# Patient Record
Sex: Male | Born: 1955 | Race: Black or African American | Hispanic: No | Marital: Single | Smoking: Former smoker
Health system: Southern US, Community
[De-identification: ages and names within clinical notes are randomized; demographics above are authoritative.]

## PROBLEM LIST (undated history)

## (undated) DIAGNOSIS — E785 Hyperlipidemia, unspecified: Secondary | ICD-10-CM

## (undated) DIAGNOSIS — I10 Essential (primary) hypertension: Secondary | ICD-10-CM

## (undated) DIAGNOSIS — E119 Type 2 diabetes mellitus without complications: Secondary | ICD-10-CM

## (undated) DIAGNOSIS — I499 Cardiac arrhythmia, unspecified: Secondary | ICD-10-CM

---

## 1999-07-20 ENCOUNTER — Encounter: Admission: RE | Admit: 1999-07-20 | Discharge: 1999-10-18 | Payer: Self-pay | Admitting: Family Medicine

## 2001-01-05 ENCOUNTER — Ambulatory Visit (HOSPITAL_COMMUNITY): Admission: RE | Admit: 2001-01-05 | Discharge: 2001-01-05 | Payer: Self-pay | Admitting: Gastroenterology

## 2017-04-09 ENCOUNTER — Other Ambulatory Visit: Payer: Self-pay

## 2017-04-09 ENCOUNTER — Encounter (HOSPITAL_BASED_OUTPATIENT_CLINIC_OR_DEPARTMENT_OTHER): Payer: Self-pay | Admitting: Emergency Medicine

## 2017-04-09 ENCOUNTER — Emergency Department (HOSPITAL_BASED_OUTPATIENT_CLINIC_OR_DEPARTMENT_OTHER): Payer: 59

## 2017-04-09 ENCOUNTER — Emergency Department (HOSPITAL_BASED_OUTPATIENT_CLINIC_OR_DEPARTMENT_OTHER)
Admission: EM | Admit: 2017-04-09 | Discharge: 2017-04-09 | Disposition: A | Discharge status: Home / Self Care | Payer: 59 | Attending: Emergency Medicine | Admitting: Emergency Medicine

## 2017-04-09 DIAGNOSIS — Z794 Long term (current) use of insulin: Secondary | ICD-10-CM | POA: Insufficient documentation

## 2017-04-09 DIAGNOSIS — Z79899 Other long term (current) drug therapy: Secondary | ICD-10-CM | POA: Insufficient documentation

## 2017-04-09 DIAGNOSIS — R109 Unspecified abdominal pain: Secondary | ICD-10-CM | POA: Diagnosis present

## 2017-04-09 DIAGNOSIS — R1031 Right lower quadrant pain: Secondary | ICD-10-CM | POA: Diagnosis not present

## 2017-04-09 DIAGNOSIS — R1011 Right upper quadrant pain: Secondary | ICD-10-CM | POA: Diagnosis not present

## 2017-04-09 DIAGNOSIS — I10 Essential (primary) hypertension: Secondary | ICD-10-CM | POA: Insufficient documentation

## 2017-04-09 DIAGNOSIS — E119 Type 2 diabetes mellitus without complications: Secondary | ICD-10-CM | POA: Diagnosis not present

## 2017-04-09 DIAGNOSIS — Z7982 Long term (current) use of aspirin: Secondary | ICD-10-CM | POA: Diagnosis not present

## 2017-04-09 HISTORY — DX: Essential (primary) hypertension: I10

## 2017-04-09 HISTORY — DX: Hyperlipidemia, unspecified: E78.5

## 2017-04-09 HISTORY — DX: Type 2 diabetes mellitus without complications: E11.9

## 2017-04-09 LAB — CBC
HEMATOCRIT: 38.3 % — AB (ref 39.0–52.0)
HEMOGLOBIN: 13.3 g/dL (ref 13.0–17.0)
MCH: 30.9 pg (ref 26.0–34.0)
MCHC: 34.7 g/dL (ref 30.0–36.0)
MCV: 88.9 fL (ref 78.0–100.0)
Platelets: 165 10*3/uL (ref 150–400)
RBC: 4.31 MIL/uL (ref 4.22–5.81)
RDW: 12.3 % (ref 11.5–15.5)
WBC: 5.3 10*3/uL (ref 4.0–10.5)

## 2017-04-09 LAB — COMPREHENSIVE METABOLIC PANEL
ALBUMIN: 3.7 g/dL (ref 3.5–5.0)
ALK PHOS: 62 U/L (ref 38–126)
ALT: 22 U/L (ref 17–63)
ANION GAP: 4 — AB (ref 5–15)
AST: 19 U/L (ref 15–41)
BILIRUBIN TOTAL: 0.3 mg/dL (ref 0.3–1.2)
BUN: 15 mg/dL (ref 6–20)
CALCIUM: 8.7 mg/dL — AB (ref 8.9–10.3)
CO2: 27 mmol/L (ref 22–32)
Chloride: 102 mmol/L (ref 101–111)
Creatinine, Ser: 0.86 mg/dL (ref 0.61–1.24)
GFR calc Af Amer: >60 mL/min (ref >60)
GFR calc non Af Amer: >60 mL/min (ref >60)
GLUCOSE: 205 mg/dL — AB (ref 65–99)
POTASSIUM: 3.7 mmol/L (ref 3.5–5.1)
SODIUM: 133 mmol/L — AB (ref 135–145)
TOTAL PROTEIN: 6.9 g/dL (ref 6.5–8.1)

## 2017-04-09 LAB — URINALYSIS, ROUTINE W REFLEX MICROSCOPIC
BILIRUBIN URINE: NEGATIVE
Glucose, UA: >=500 mg/dL — AB
Hgb urine dipstick: NEGATIVE
KETONES UR: NEGATIVE mg/dL
Leukocytes, UA: NEGATIVE
Nitrite: NEGATIVE
PH: 5.5 (ref 5.0–8.0)
Protein, ur: NEGATIVE mg/dL

## 2017-04-09 LAB — URINALYSIS, MICROSCOPIC (REFLEX): RBC / HPF: NONE SEEN RBC/hpf (ref 0–5)

## 2017-04-09 LAB — LIPASE, BLOOD: Lipase: 145 U/L — ABNORMAL HIGH (ref 11–51)

## 2017-04-09 IMAGING — CT CT RENAL STONE PROTOCOL
2 of 4 series · 16 of 46 positions shown, 18 images · non-contrast
Comparison: None.

CLINICAL DATA: Four day history of flank pain with nausea

EXAM:
CT ABDOMEN AND PELVIS WITHOUT CONTRAST
TECHNIQUE: Multidetector CT imaging of the abdomen and pelvis was performed
following the standard protocol without oral or IV contrast.

[Series 2: axial st · axial · 0.81mm/px · z∈[-472,-42]mm · 13 of 94 slices shown, 15 images]
[im 4/94  soft-tissue]
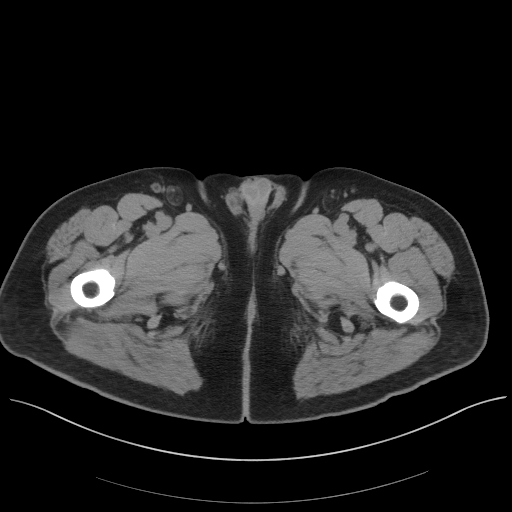
[im 4/94  bone]
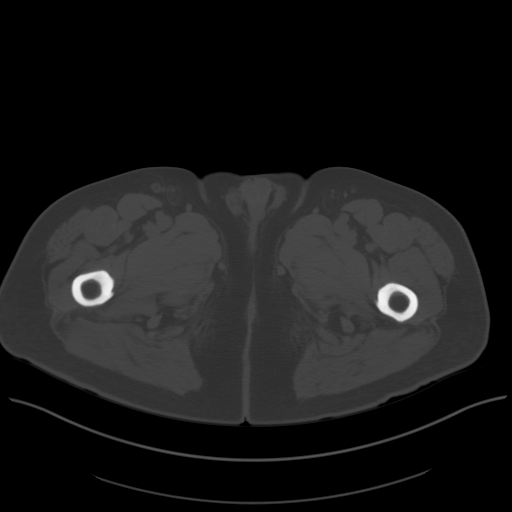
[im 12/94  soft-tissue]
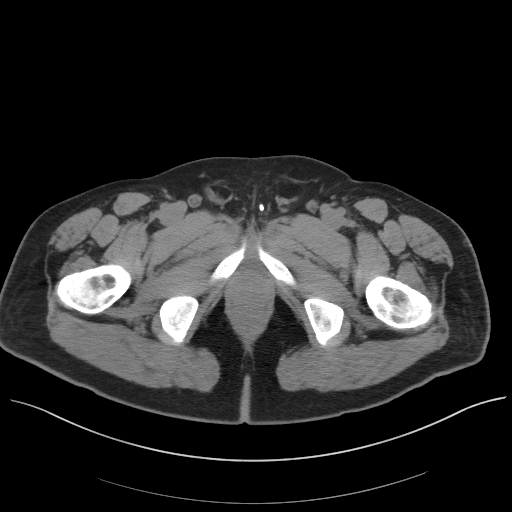
[im 19/94  soft-tissue]
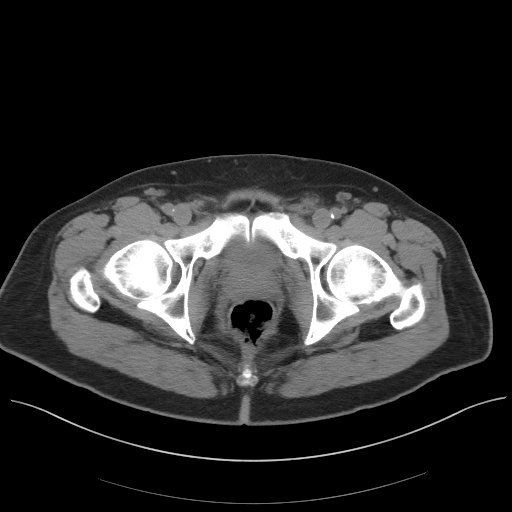
[im 27/94  soft-tissue]
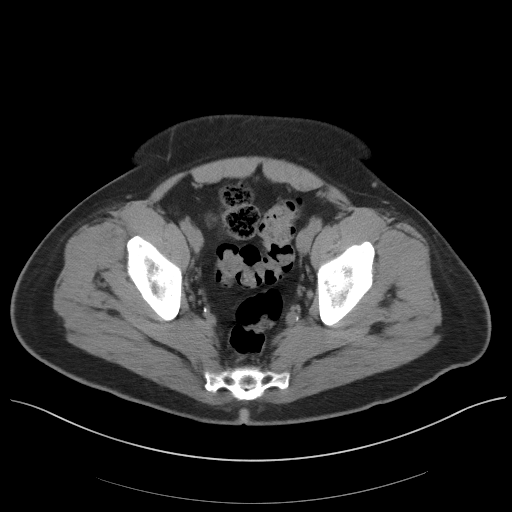
[im 34/94  soft-tissue]
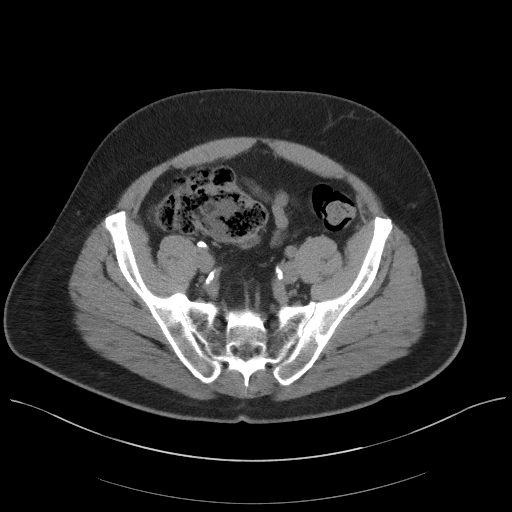
[im 41/94  soft-tissue]
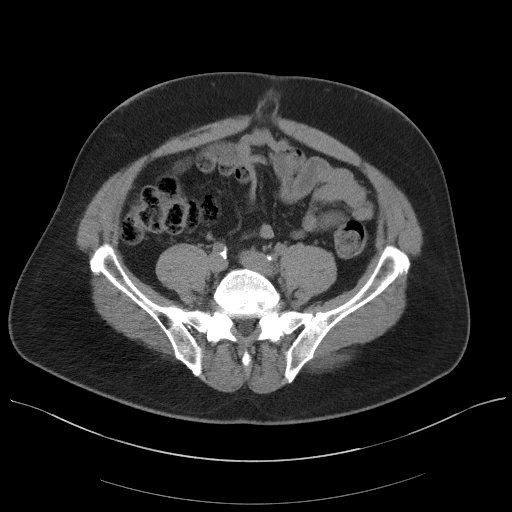
[im 49/94  soft-tissue]
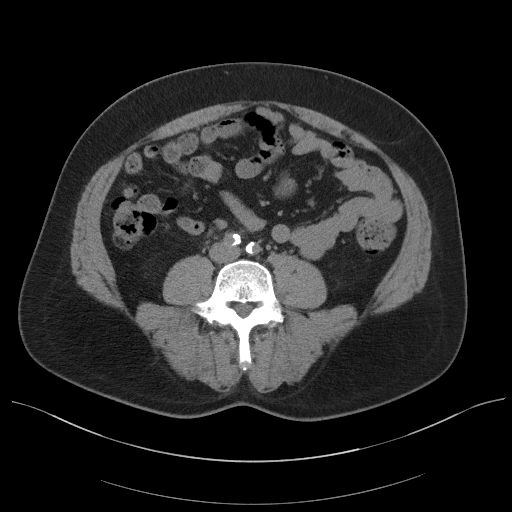
[im 53/94  soft-tissue]
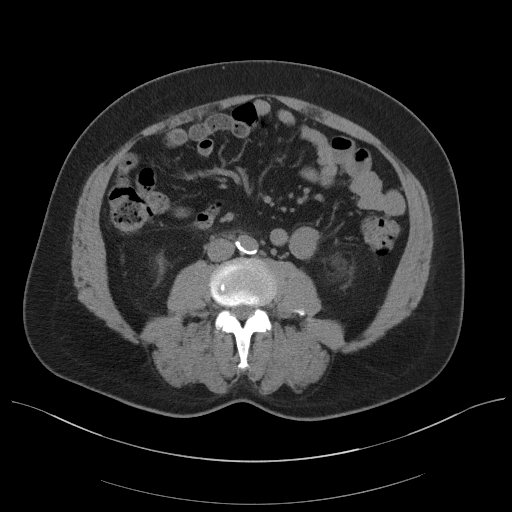
[im 60/94  soft-tissue]
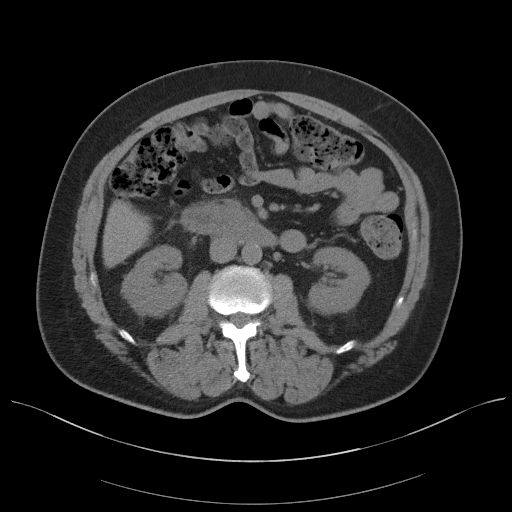
[im 60/94  bone]
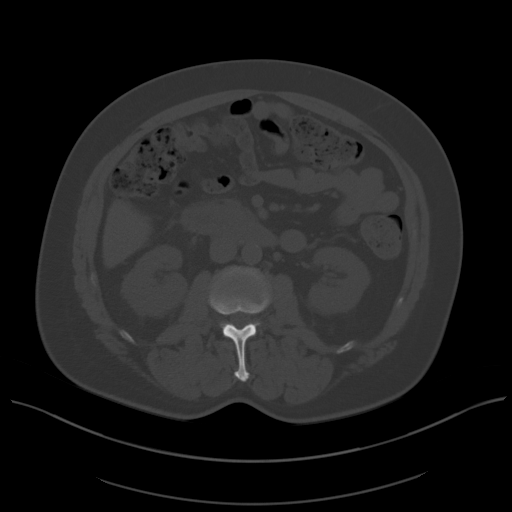
[im 67/94  soft-tissue]
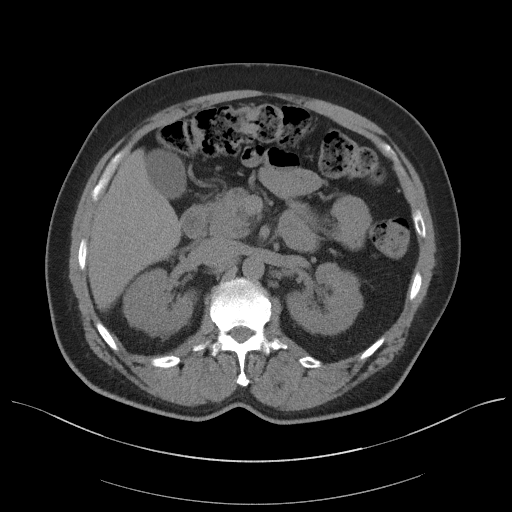
[im 75/94  soft-tissue]
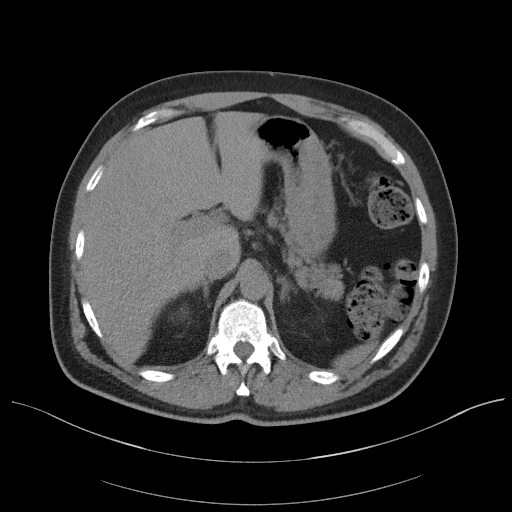
[im 82/94  soft-tissue]
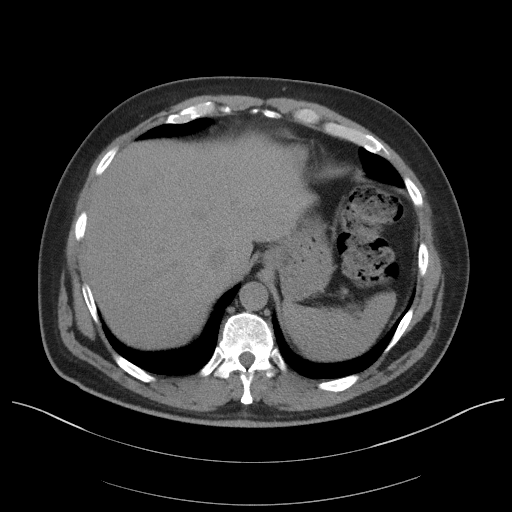
[im 90/94  soft-tissue]
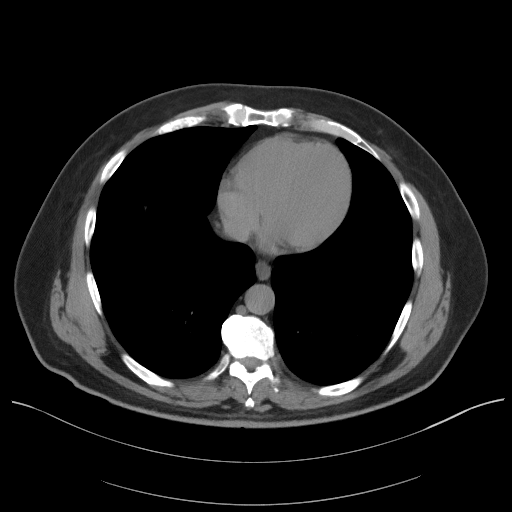

[Series 5: coronal st · coronal · 0.80mm/px · 3 of 101 slices shown]
[im 34/101  soft-tissue]
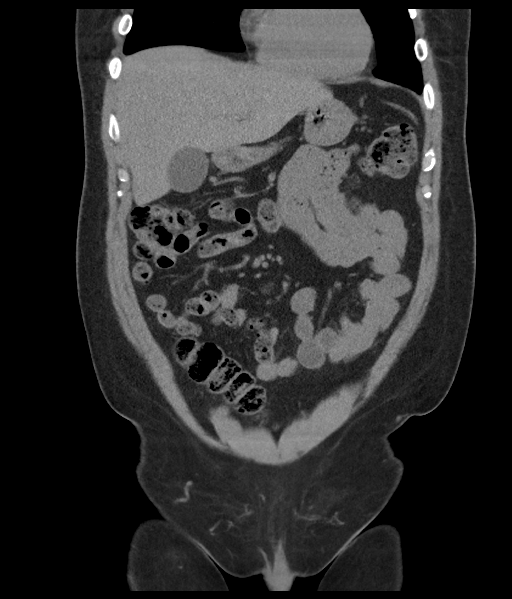
[im 45/101  soft-tissue]
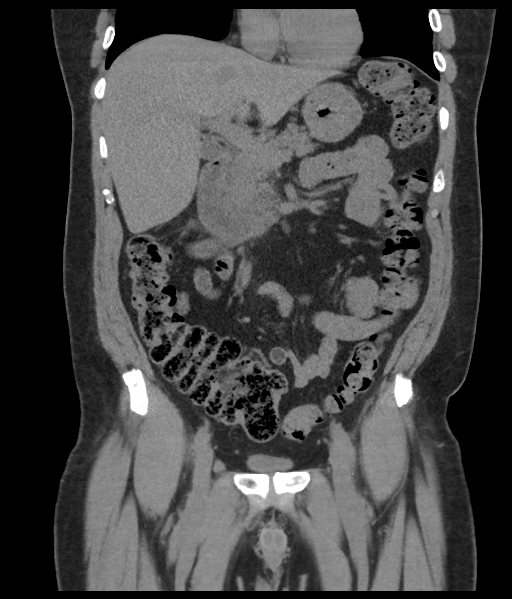
[im 56/101  soft-tissue]
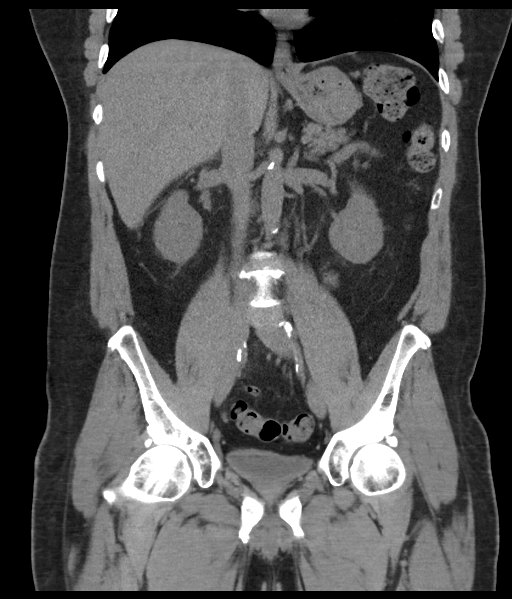

[16 of 46 positions shown; findings below may reference images not displayed]

FINDINGS: Lower chest: Lung bases are clear.

Hepatobiliary: No focal liver lesions are evident on this
noncontrast enhanced study. Gallbladder wall is not appreciably
thickened. There is no biliary duct dilatation.

Pancreas: There is no pancreatic mass or inflammatory focus.

Spleen: No splenic lesions are evident.

Adrenals/Urinary Tract: Adrenals bilaterally appear normal. Kidneys
bilaterally show no evident mass or hydronephrosis on either side.
There is no renal or ureteral calculus on either side. Urinary
bladder is midline with wall thickness within normal limits.

Stomach/Bowel: There is no appreciable bowel wall or mesenteric
thickening. No bowel obstruction. No free air or portal venous air.

Vascular/Lymphatic: There is atherosclerotic calcification in the
aorta and common iliac arteries. Calcification is also noted in the
right external iliac and hypogastric arteries bilaterally. No
aneurysm evident. Major mesenteric vessels appear patent on this
noncontrast enhanced study. No adenopathy is appreciable in the
abdomen or pelvis.

Reproductive: Prostate and seminal vesicles appear normal in size
and contour. No evident pelvic mass.

Other: Appendix appears unremarkable. There is no ascites or abscess
in the abdomen or pelvis. There is a small ventral hernia containing
only fat.

Musculoskeletal: There are foci of degenerative change in the lower
thoracic and lumbar spine regions. There is no blastic or lytic bone
lesion. There is no intramuscular or abdominal wall lesion.
IMPRESSION: 1. No renal or ureteral calculus on either side. No hydronephrosis.

2.  No bowel obstruction.  No abscess.  Appendix appears normal.

3.  Aortoiliac atherosclerosis.  No aneurysm.

4.  Small ventral hernia containing only fat.

Aortic Atherosclerosis ([H2]-[H2]).

## 2017-04-09 NOTE — ED Provider Notes (Signed)
[facility] EMERGENCY DEPARTMENT Provider Note   CSN: [id number] Arrival date & time: 04/09/17  8657     History   Chief Complaint Chief Complaint  Patient presents with  . Abdominal Pain  . Back Pain    HPI Samuel Walters is a 62 y.o. male.  HPI 62 year old male presents with 4 days of back and abdominal pain.  He states it started when he first woke up 4 days ago and has been intermittent since.  When he is up doing things he does not seem to notice it but when he sits down and rests the pain seems to come back.  At the moment I am talking to him he has no pain but it does seem to come and go.  Seem to start in the back and goes towards his abdomen.  No specific movements make it worse.  No nausea, vomiting, chest pain, shortness of breath.  No fevers or urinary symptoms.  No weakness or numbness in his lower extremities and no incontinence.  Past Medical History:  Diagnosis Date  . Diabetes mellitus without complication (HCC)   . Hyperlipidemia   . Hypertension     There are no active problems to display for this patient.   History reviewed. No pertinent surgical history.     Home Medications    Prior to Admission medications   Medication Sig Start Date End Date Taking? Authorizing Provider  aspirin EC 81 MG tablet Take 81 mg by mouth daily.   Yes [provider]  canagliflozin (INVOKANA) 300 MG TABS tablet Take 300 mg by mouth daily before breakfast.   Yes [provider]  insulin glargine (LANTUS) 100 UNIT/ML injection Inject into the skin at bedtime.   Yes [provider]  losartan-hydrochlorothiazide (HYZAAR) 100-12.5 MG tablet Take 1 tablet by mouth daily.   Yes [provider]  lovastatin (MEVACOR) 20 MG tablet Take 20 mg by mouth at bedtime.   Yes [provider]  meloxicam (MOBIC) 15 MG tablet Take 15 mg by mouth daily.   Yes [provider]  metFORMIN (GLUCOPHAGE-XR) 500 MG 24 hr tablet Take  500 mg by mouth daily with breakfast.   Yes [provider]    Family History No family history on file.  Social History Social History   Tobacco Use  . Smoking status: Never Smoker  . Smokeless tobacco: Never Used  Substance Use Topics  . Alcohol use: No    Frequency: Never  . Drug use: No     Allergies   Patient has no known allergies.   Review of Systems Review of Systems  Constitutional: Negative for fever.  Respiratory: Negative for shortness of breath.   Cardiovascular: Negative for chest pain.  Gastrointestinal: Positive for abdominal pain. Negative for nausea and vomiting.  Genitourinary: Negative for dysuria and hematuria.  Musculoskeletal: Positive for back pain.  All other systems reviewed and are negative.    Physical Exam Updated Vital Signs BP 113/84   Pulse 74   Temp 97.8 F (36.6 C) (Oral)   Resp 16   Ht 5\' 11"  (1.803 m)   Wt 89.4 kg (197 lb)   SpO2 100%   BMI 27.48 kg/m   Physical Exam  Constitutional: He is oriented to person, place, and time. He appears well-developed and well-nourished.  Non-toxic appearance. He does not appear ill. No distress.  Resting comfortably, sitting up in stretcher  HENT:  Head: Normocephalic and atraumatic.  Right Ear: External ear  normal.  Left Ear: External ear normal.  Nose: Nose normal.  Eyes: Right eye exhibits no discharge. Left eye exhibits no discharge.  Neck: Neck supple.  Cardiovascular: Normal rate, regular rhythm and normal heart sounds.  Pulmonary/Chest: Effort normal and breath sounds normal.  Abdominal: Soft. There is tenderness (mild) in the right upper quadrant and right lower quadrant. There is no CVA tenderness.  Musculoskeletal: He exhibits no edema.       Cervical back: He exhibits no tenderness and no bony tenderness.       Thoracic back: He exhibits no tenderness and no bony tenderness.       Lumbar back: He exhibits no tenderness and no bony tenderness.  Neurological: He is  alert and oriented to person, place, and time.  5/5 strength in BLE. Normal gross sensation  Skin: Skin is warm and dry.  Nursing note and vitals reviewed.    ED Treatments / Results  Labs (all labs ordered are listed, but only abnormal results are displayed) Labs Reviewed  LIPASE, BLOOD - Abnormal; Notable for the following components:      Result Value   Lipase 145 (*)    All other components within normal limits  COMPREHENSIVE METABOLIC PANEL - Abnormal; Notable for the following components:   Sodium 133 (*)    Glucose, Bld 205 (*)    Calcium 8.7 (*)    Anion gap 4 (*)    All other components within normal limits  CBC - Abnormal; Notable for the following components:   HCT 38.3 (*)    All other components within normal limits  URINALYSIS, ROUTINE W REFLEX MICROSCOPIC - Abnormal; Notable for the following components:   Specific Gravity, Urine >1.030 (*)    Glucose, UA >=500 (*)    All other components within normal limits  URINALYSIS, MICROSCOPIC (REFLEX) - Abnormal; Notable for the following components:   Bacteria, UA MANY (*)    Squamous Epithelial / LPF 0-5 (*)    All other components within normal limits    EKG  EKG Interpretation None       Radiology Ct Renal Stone Study  Result Date: 04/09/2017 CLINICAL DATA:  Four day history of flank pain with nausea EXAM: CT ABDOMEN AND PELVIS WITHOUT CONTRAST TECHNIQUE: Multidetector CT imaging of the abdomen and pelvis was performed following the standard protocol without oral or IV contrast. COMPARISON:  None. FINDINGS: Lower chest: Lung bases are clear. Hepatobiliary: No focal liver lesions are evident on this noncontrast enhanced study. Gallbladder wall is not appreciably thickened. There is no biliary duct dilatation. Pancreas: There is no pancreatic mass or inflammatory focus. Spleen: No splenic lesions are evident. Adrenals/Urinary Tract: Adrenals bilaterally appear normal. Kidneys bilaterally show no evident mass or  hydronephrosis on either side. There is no renal or ureteral calculus on either side. Urinary bladder is midline with wall thickness within normal limits. Stomach/Bowel: There is no appreciable bowel wall or mesenteric thickening. No bowel obstruction. No free air or portal venous air. Vascular/Lymphatic: There is atherosclerotic calcification in the aorta and common iliac arteries. Calcification is also noted in the right external iliac and hypogastric arteries bilaterally. No aneurysm evident. Major mesenteric vessels appear patent on this noncontrast enhanced study. No adenopathy is appreciable in the abdomen or pelvis. Reproductive: Prostate and seminal vesicles appear normal in size and contour. No evident pelvic mass. Other: Appendix appears unremarkable. There is no ascites or abscess in the abdomen or pelvis. There is a small ventral hernia containing only fat.  Musculoskeletal: There are foci of degenerative change in the lower thoracic and lumbar spine regions. There is no blastic or lytic bone lesion. There is no intramuscular or abdominal wall lesion. IMPRESSION: 1. No renal or ureteral calculus on either side. No hydronephrosis. 2.  No bowel obstruction.  No abscess.  Appendix appears normal. 3.  Aortoiliac atherosclerosis.  No aneurysm. 4.  Small ventral hernia containing only fat. Aortic Atherosclerosis (ICD10-I70.0). Electronically Signed   By: Bretta BangWilliam  Woodruff III M.D.   On: 04/09/2017 08:25    Procedures Procedures (including critical care time)  Medications Ordered in ED Medications - No data to display   Initial Impression / Assessment and Plan / ED Course  I have reviewed the triage vital signs and the nursing notes.  Pertinent labs & imaging results that were available during my care of the patient were reviewed by me and considered in my medical decision making (see chart for details).     On exam, no abdominal or back tenderness.  Lab work is significant only for lipase of  145 which is almost 3 times upper limit of normal.  His pain is not quit typical of pancreatitis and he has had no troubles eating or pain after or during eating.  LFTs benign.  He does not drink alcohol.  Given no vomiting and well-controlled pain I think is reasonable to treat him and workup as an outpatient with follow-up with PCP.  CT does not show any obvious gallbladder pathology or pancreatic stranding.  CT also does not show an obvious other cause for back or abdominal pain such as AAA or renal/ureteral stone.  He is well-appearing.  He does have bacteria in his urine but no signs of UTI and I think this is asymptomatic bacteriuria.  Tylenol, plenty of fluids, and follow-up with PCP.  Discussed return precautions.  Final Clinical Impressions(s) / ED Diagnoses   Final diagnoses:  Abdominal pain, unspecified abdominal location    ED Discharge Orders    None       Pricilla LovelessGoldston, Kealan Buchan, MD 04/09/17 1019

## 2017-04-09 NOTE — ED Triage Notes (Signed)
Pt reports generalized abd pain and back pain since Wednesday. Denies N/V/D

## 2017-04-09 NOTE — Discharge Instructions (Signed)
Your lab work indicates possible mild pancreatitis.  Make sure you drink plenty of fluids.  Take Tylenol as per label instructions as needed for pain.  If you develop vomiting, worsening pain, fever, or other new/concerning symptoms return to the ER immediately.  Otherwise follow-up with your primary care doctor.

## 2019-12-10 ENCOUNTER — Other Ambulatory Visit: Payer: Self-pay | Admitting: Family Medicine

## 2019-12-11 ENCOUNTER — Other Ambulatory Visit: Payer: Self-pay | Admitting: Family Medicine

## 2019-12-11 DIAGNOSIS — S63641D Sprain of metacarpophalangeal joint of right thumb, subsequent encounter: Secondary | ICD-10-CM

## 2019-12-31 ENCOUNTER — Other Ambulatory Visit: Payer: 59

## 2020-01-02 ENCOUNTER — Other Ambulatory Visit: Payer: Self-pay

## 2020-01-02 ENCOUNTER — Ambulatory Visit
Admission: RE | Admit: 2020-01-02 | Discharge: 2020-01-02 | Disposition: A | Discharge status: Home / Self Care | Payer: Worker's Compensation | Source: Ambulatory Visit | Attending: Family Medicine | Admitting: Family Medicine

## 2020-01-02 DIAGNOSIS — S63641D Sprain of metacarpophalangeal joint of right thumb, subsequent encounter: Secondary | ICD-10-CM

## 2020-01-02 IMAGING — MR MR ELBOW*R* W/O CM
4 of 5 series · 14 of 40 positions shown · non-contrast
Comparison: None.

CLINICAL DATA: Right elbow pain and thumb pain. Injured at work
lifting something in [REDACTED].

EXAM:
MRI OF THE RIGHT ELBOW WITHOUT CONTRAST
TECHNIQUE: Multiplanar, multisequence MR imaging of the elbow was performed. No
intravenous contrast was administered.

[Series 3: T1 · axial · right · 3.0mm · 0.16mm/px · z∈[-15,+59]mm · 3 of 27 slices shown]
[im 4/27]
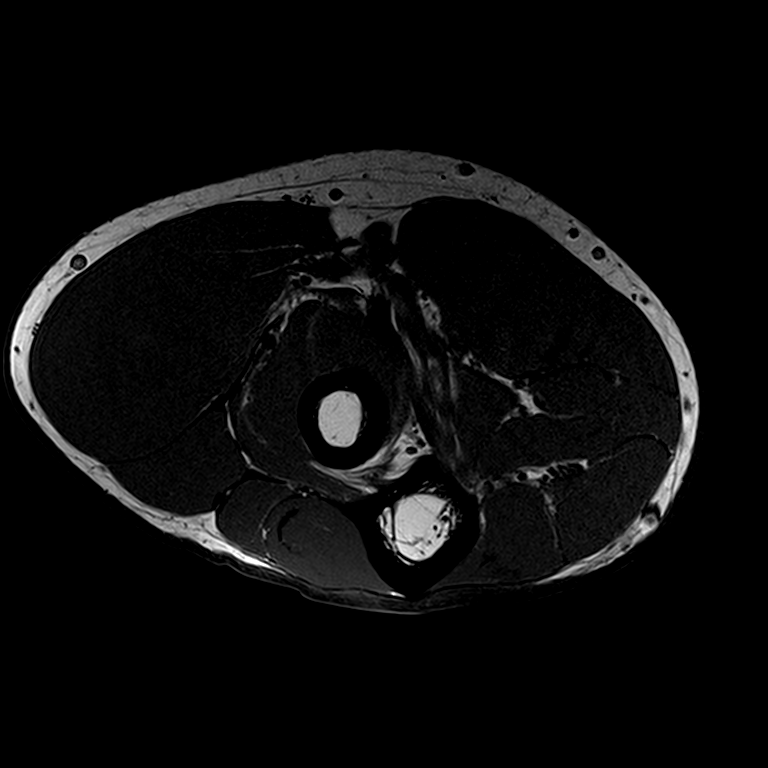
[im 14/27]
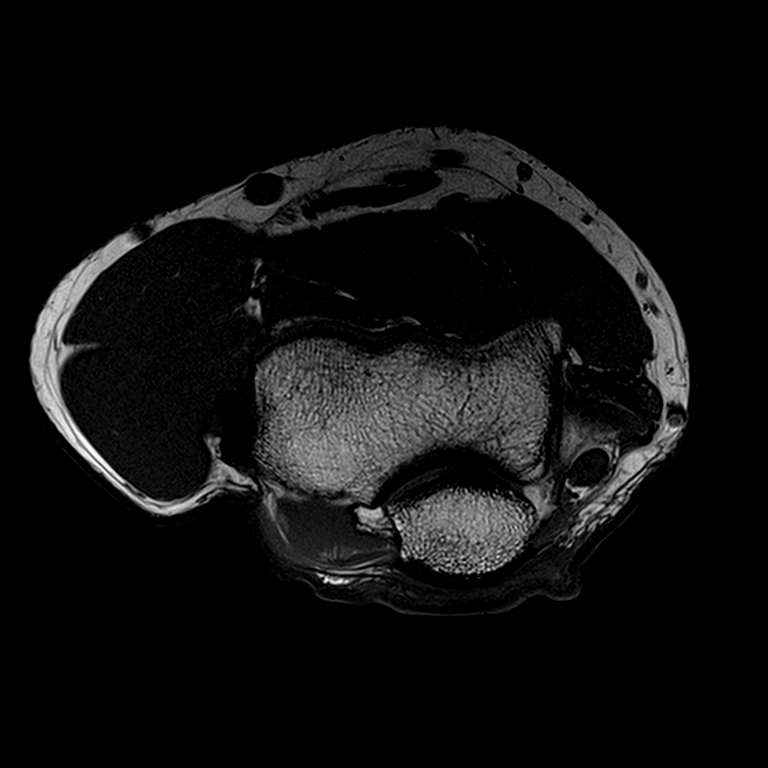
[im 23/27]
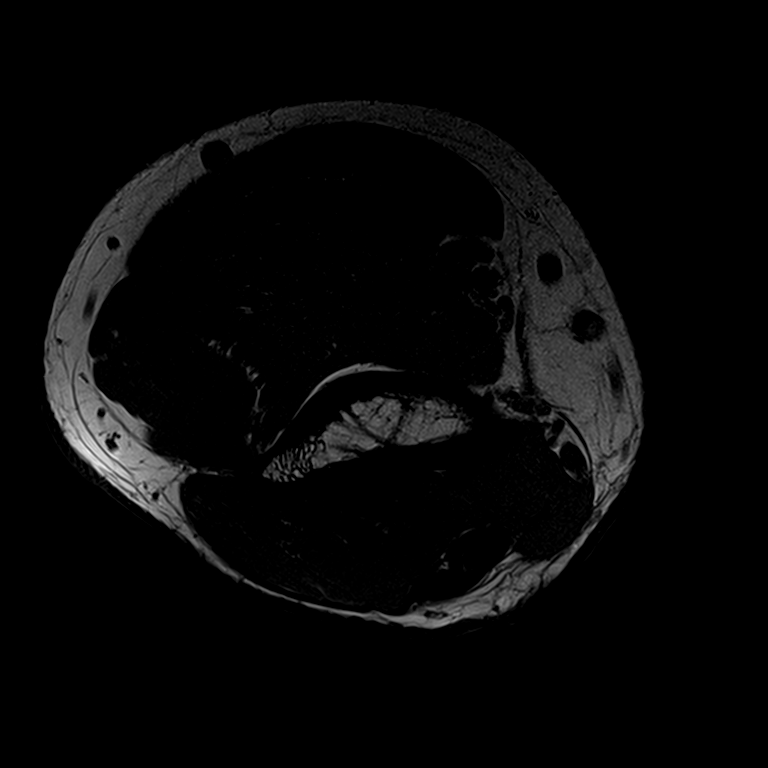

[Series 4: T2 fat-sat · axial · right · 3.0mm · 0.19mm/px · z∈[-13,+60]mm · 3 of 27 slices shown (1 of 2)]
[im 4/27]
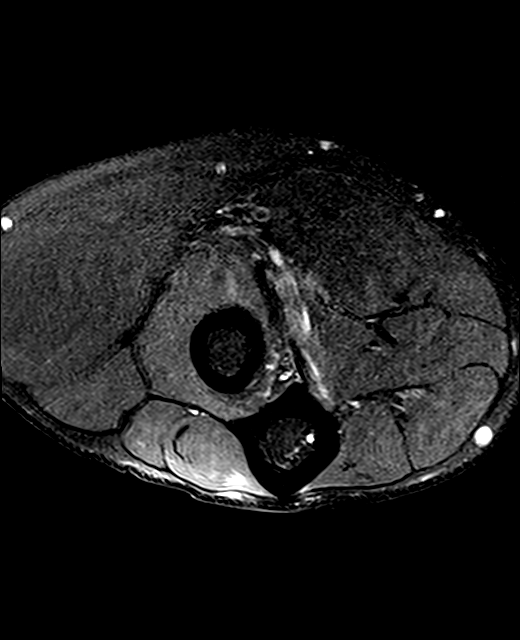
[im 15/27]
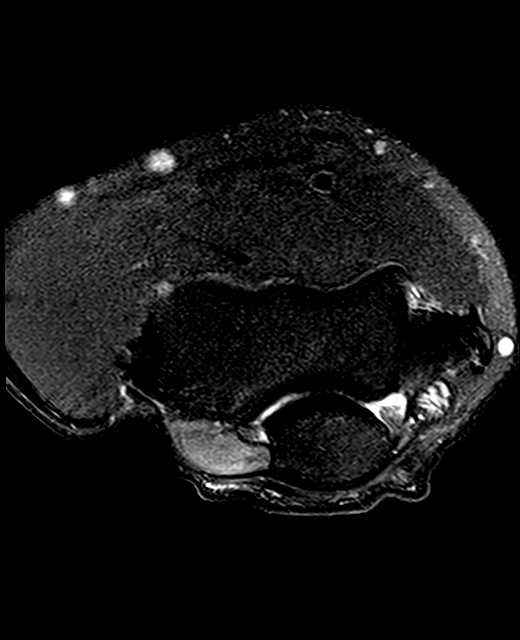
[im 23/27]
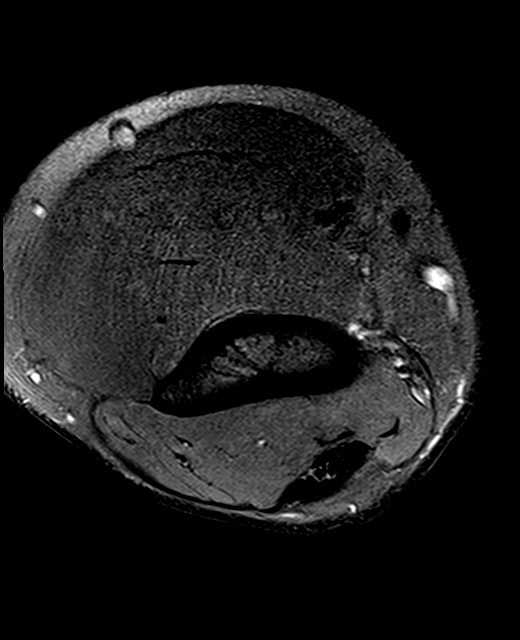

[Series 5: T2 fat-sat · coronal · right · 3.0mm · 0.24mm/px · 3 of 24 slices shown (2 of 2)]
[im 4/24]
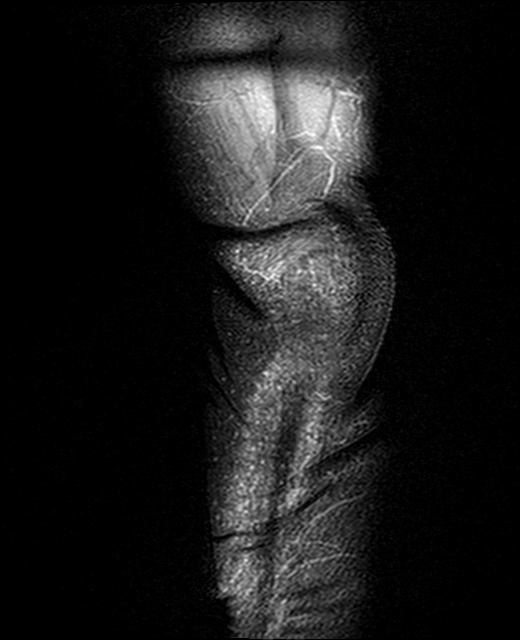
[im 12/24]
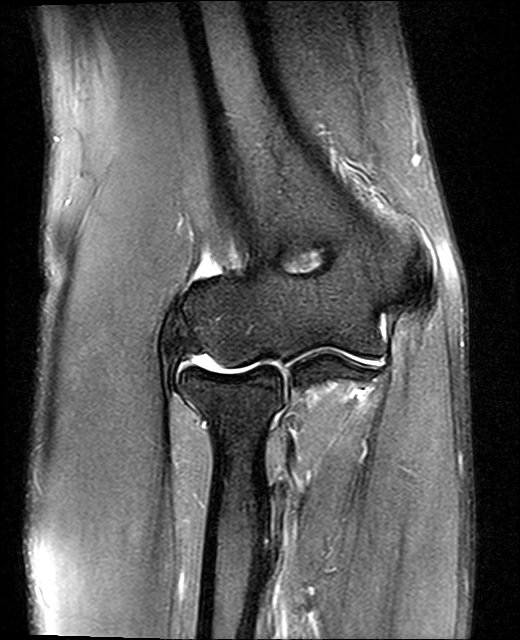
[im 20/24]
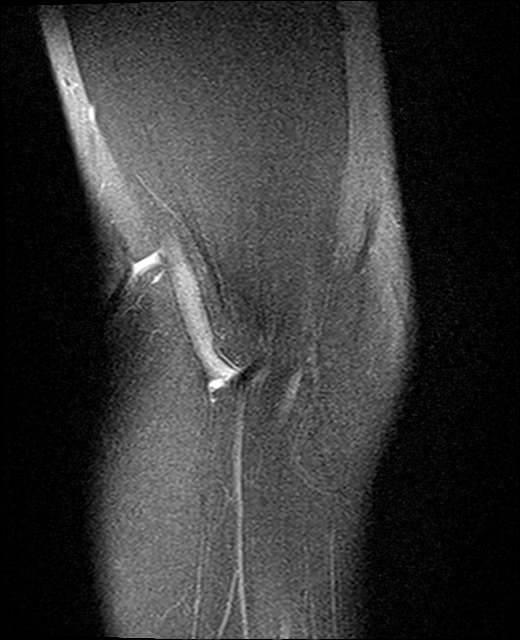

[Series 7: PD fat-sat · sagittal · right · 3.0mm · 0.22mm/px · 5 of 31 slices shown]
[im 1/31]
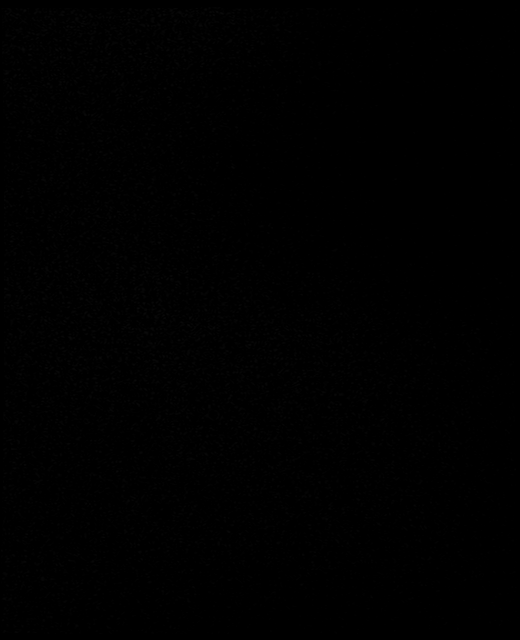
[im 4/31]
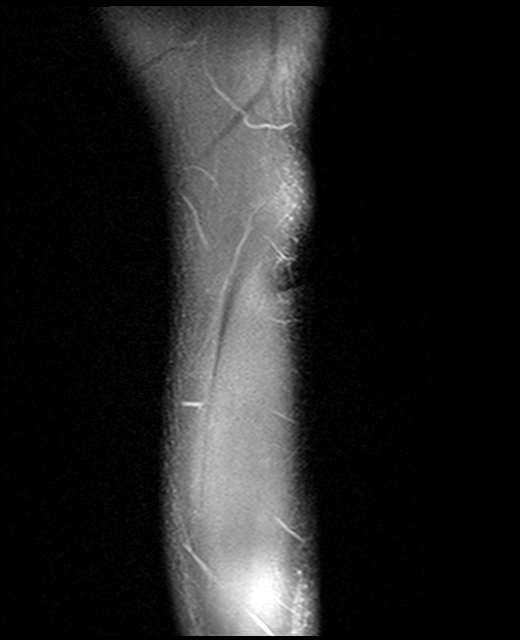
[im 8/31]
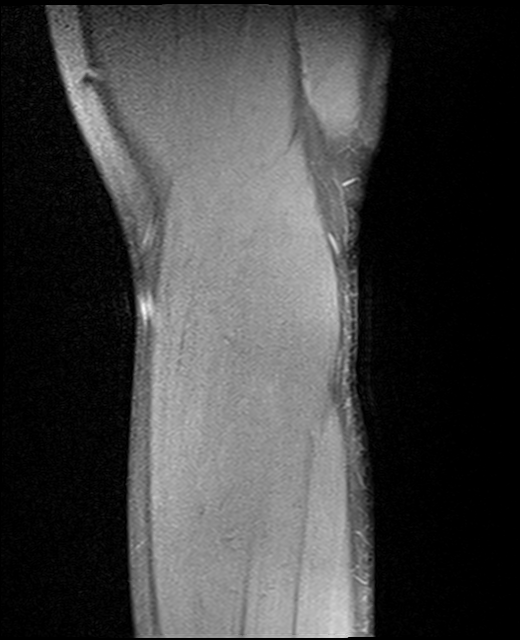
[im 16/31]
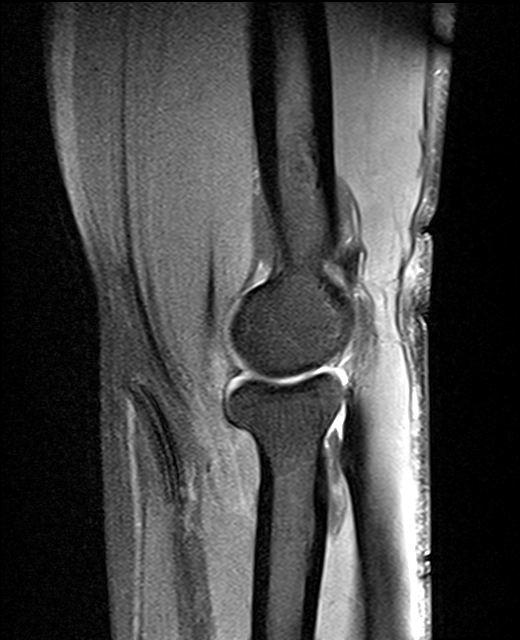
[im 27/31]
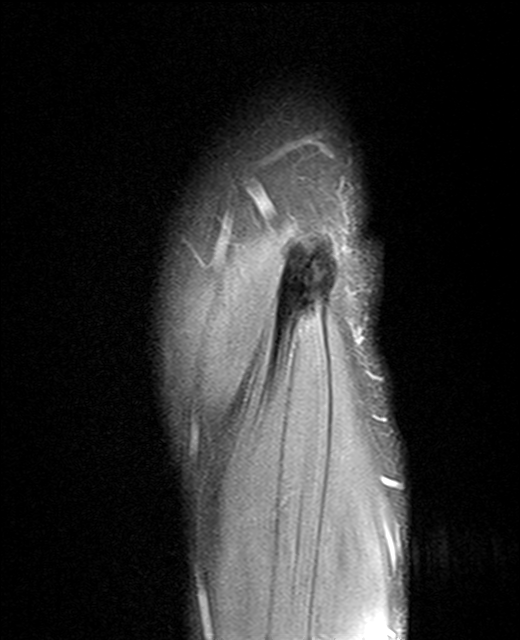

[14 of 40 positions shown; findings below may reference images not displayed]

FINDINGS: TENDONS

Common forearm flexor origin: Mild tendinosis of the common flexor
tendon origin.

Common forearm extensor origin: Intact

Biceps: Moderate tendinosis of the distal biceps tendon at its
insertion without a discrete tear.

Triceps: Intact

LIGAMENTS

Medial stabilizers: Intact

Lateral stabilizers:  Intact

Cartilage: No chondral defect.

Joint: No joint effusion. No synovitis.

Cubital tunnel: Normal.

Bones: No fracture or dislocation. No marrow abnormality.

Soft Tissues: Muscles are normal. No fluid collection or hematoma.
IMPRESSION: 1. Moderate tendinosis of the distal biceps tendon at its insertion
without a discrete tear.
2. Mild tendinosis of the common flexor tendon origin.

## 2020-01-02 IMAGING — MR MR WRIST*R* W/O CM
6 series · 40 of 40 positions shown · non-contrast
Comparison: None.

CLINICAL DATA: Right thumb pain and swelling. History of a lifting
injury in [DATE].

EXAM:
MR OF THE RIGHT WRIST WITHOUT CONTRAST
TECHNIQUE: Multiplanar, multisequence MR imaging of the right wrist was
performed. No intravenous contrast was administered.

[Series 3: T2 fat-sat · axial · right · 3.0mm · 0.35mm/px · z∈[-46,+42]mm · 8 of 30 slices shown (1 of 2)]
[im 1/30]
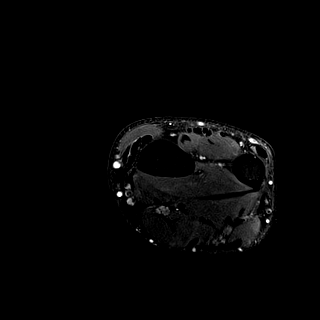
[im 5/30]
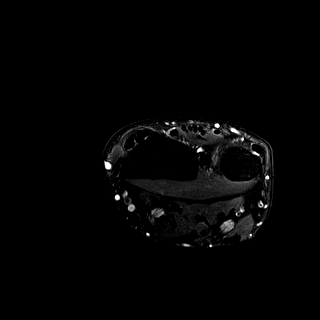
[im 9/30]
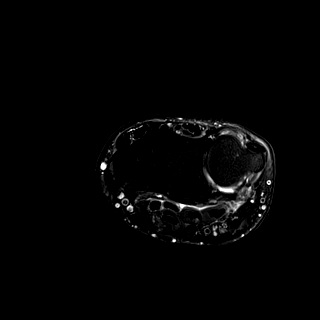
[im 13/30]
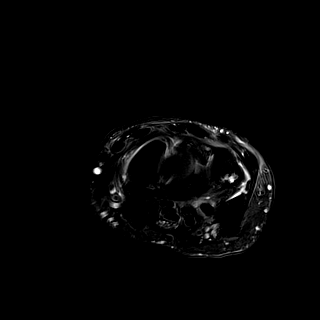
[im 17/30]
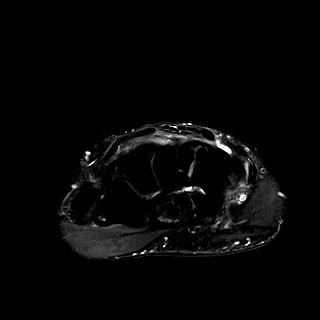
[im 21/30]
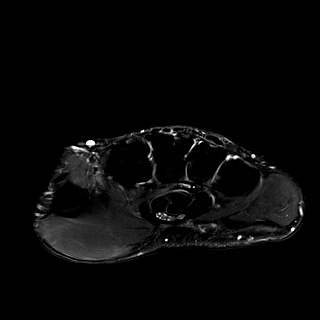
[im 25/30]
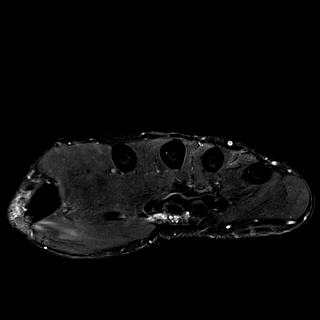
[im 30/30]
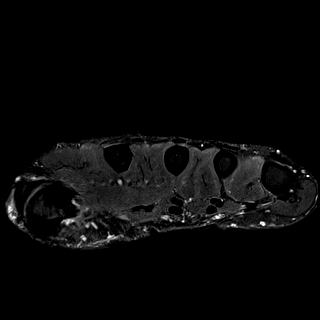

[Series 5: T1 · coronal · right · 3.0mm · 0.31mm/px · 5 of 21 slices shown (1 of 2)]
[im 1/21]
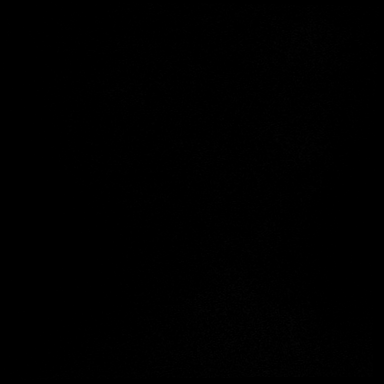
[im 6/21]
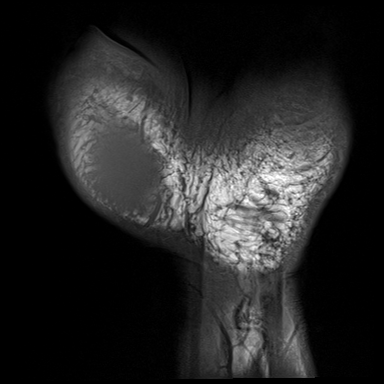
[im 11/21]
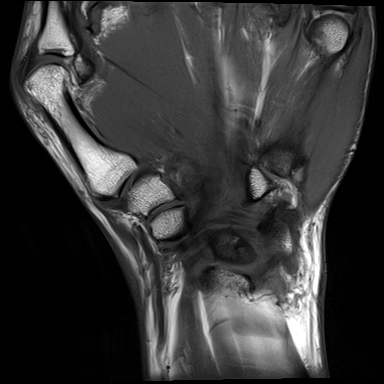
[im 16/21]
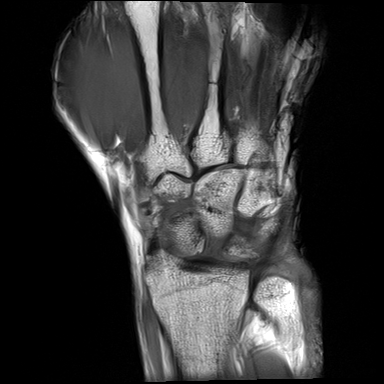
[im 21/21]
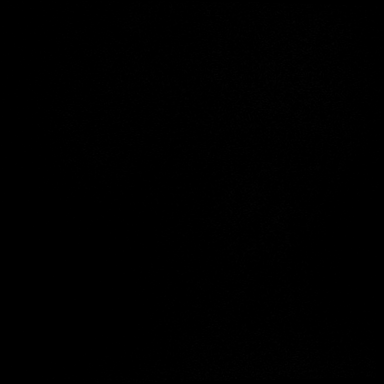

[Series 6: T1 · axial · right · 3.0mm · 0.35mm/px · z∈[-46,+42]mm · 8 of 30 slices shown (2 of 2)]
[im 1/30]
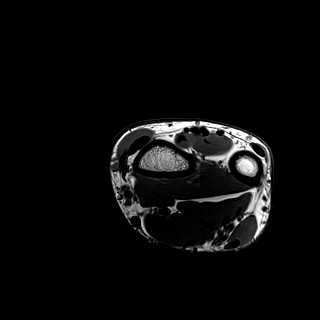
[im 5/30]
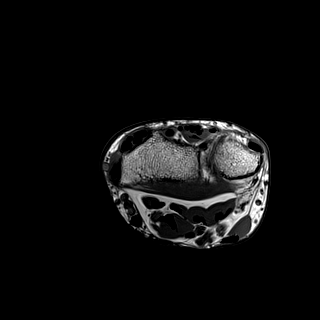
[im 9/30]
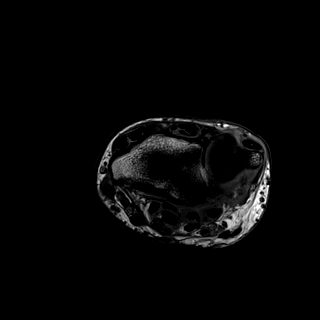
[im 13/30]
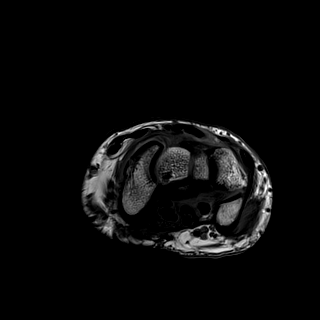
[im 17/30]
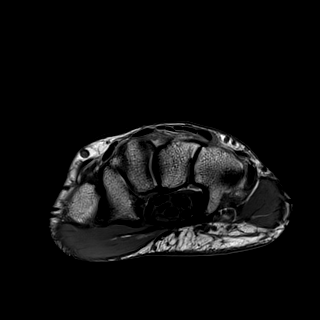
[im 21/30]
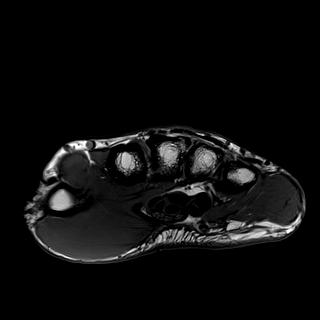
[im 25/30]
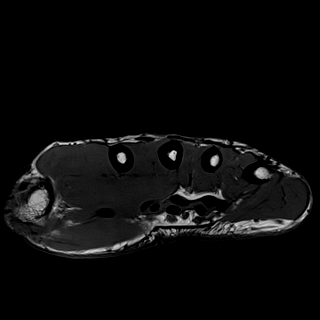
[im 30/30]
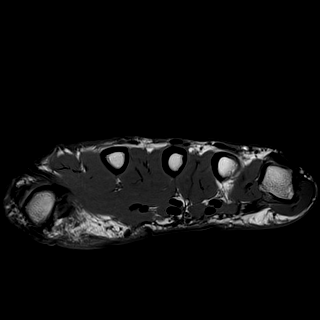

[Series 7: T2 fat-sat · coronal · right · 3.0mm · 0.31mm/px · 5 of 21 slices shown (2 of 2)]
[im 1/21]
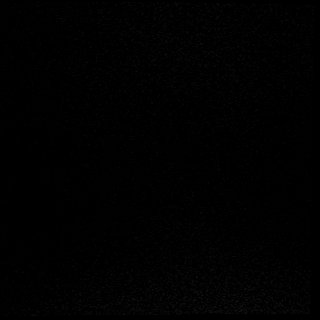
[im 6/21]
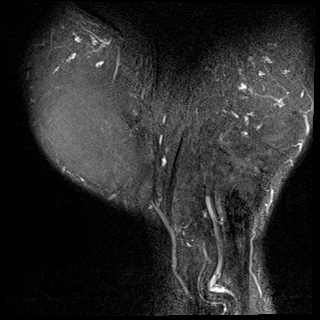
[im 11/21]
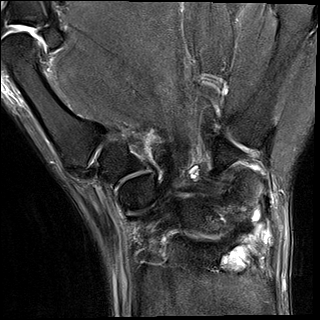
[im 16/21]
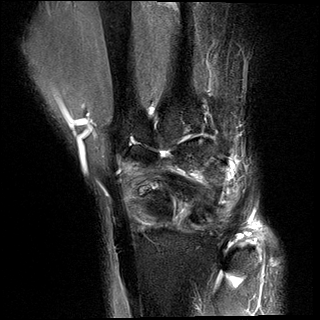
[im 21/21]
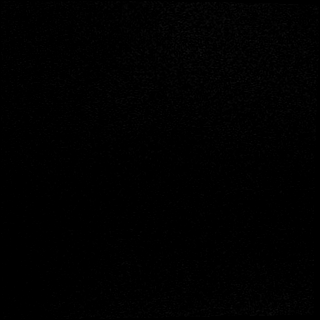

[Series 8: PD fat-sat · coronal · right · 3.0mm · 0.36mm/px · 5 of 21 slices shown (1 of 2)]
[im 1/21]
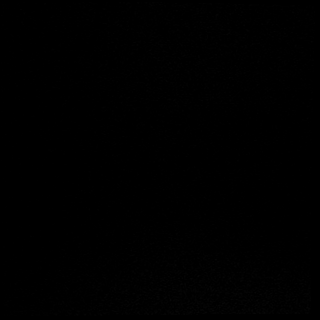
[im 6/21]
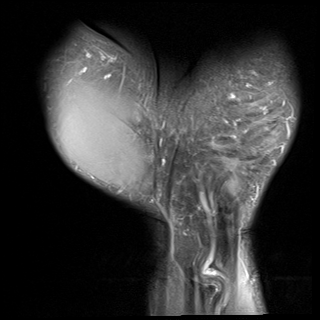
[im 11/21]
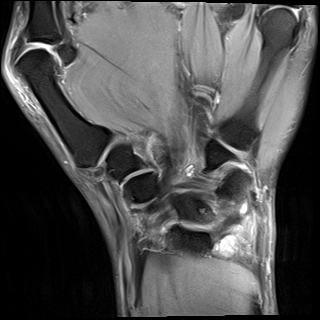
[im 16/21]
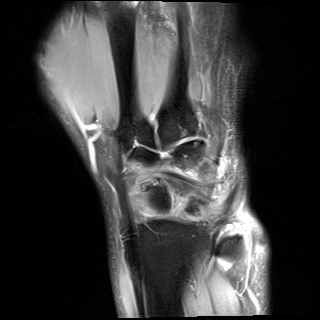
[im 21/21]
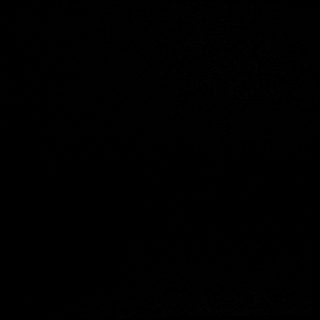

[Series 9: PD fat-sat · sagittal · right · 3.0mm · 0.31mm/px · 9 of 35 slices shown (2 of 2)]
[im 1/35]
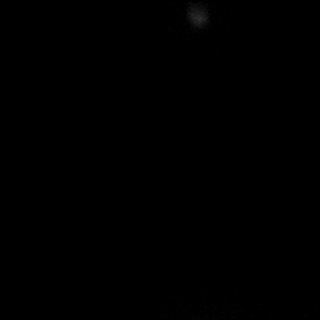
[im 5/35]
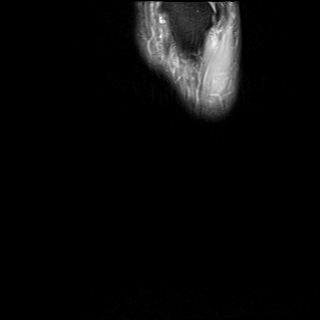
[im 9/35]
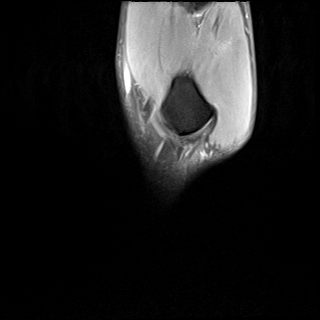
[im 13/35]
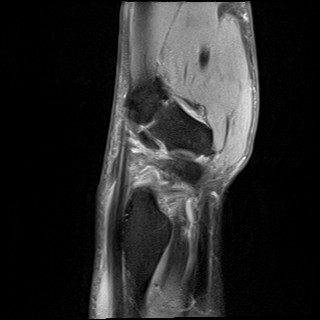
[im 18/35]
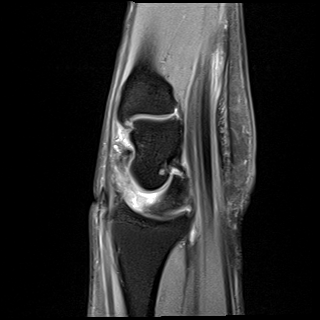
[im 22/35]
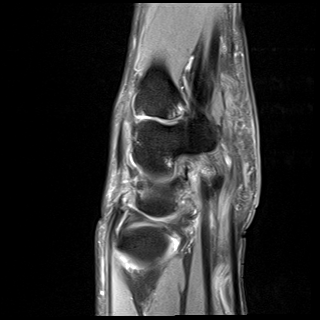
[im 26/35]
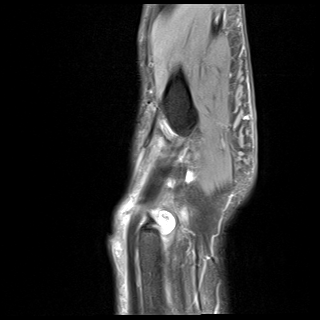
[im 30/35]
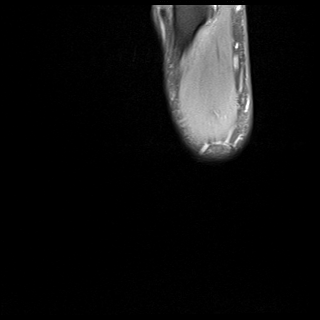
[im 35/35]
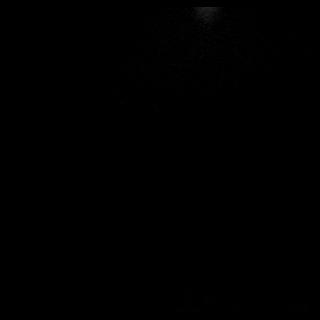

[40 of 40 positions shown; findings below may reference images not displayed]

FINDINGS: Ligaments: The scapholunate ligament is completely torn. The
lunotriquetral ligament appears intact.

Triangular fibrocartilage: There is a tear through the disc of the
triangular fibrocartilage.

Tendons: Intact. There is mild intrasubstance increased T2 signal in
the extensor carpi ulnaris consistent with tendinosis. The ECU
subsheath appears intact.

Carpal tunnel/median nerve: The median nerve appears mildly
thickened and T2 hyperintense. No fluid collection or mass impinging
on the nerve is identified.

Guyon's canal: Normal.

Joint/cartilage: Scattered degenerative change is seen about the
carpus with small subchondral cysts.

Bones/carpal alignment: There is dorsal angulation of the lunate
with the capital lunate angle of 40 degrees consistent with DISI. No
fracture, contusion or worrisome lesion.

Other: None.
IMPRESSION: Complete scapholunate ligament tear with secondary DISI.

Tear of the disc of the triangular fibrocartilage.

Extensor carpi ulnaris tendinosis without tear.

Findings suggestive carpal tunnel syndrome. No fluid collection or
mass impinging on the median nerve is identified.

Scattered mild degenerative disease about the carpus.

## 2020-06-20 ENCOUNTER — Other Ambulatory Visit: Payer: Self-pay

## 2020-06-20 ENCOUNTER — Emergency Department (HOSPITAL_BASED_OUTPATIENT_CLINIC_OR_DEPARTMENT_OTHER)
Admission: EM | Admit: 2020-06-20 | Discharge: 2020-06-20 | Disposition: A | Discharge status: Home / Self Care | Payer: 59 | Attending: Emergency Medicine | Admitting: Emergency Medicine

## 2020-06-20 ENCOUNTER — Encounter (HOSPITAL_BASED_OUTPATIENT_CLINIC_OR_DEPARTMENT_OTHER): Payer: Self-pay

## 2020-06-20 ENCOUNTER — Emergency Department (HOSPITAL_BASED_OUTPATIENT_CLINIC_OR_DEPARTMENT_OTHER): Payer: 59

## 2020-06-20 DIAGNOSIS — R079 Chest pain, unspecified: Secondary | ICD-10-CM | POA: Insufficient documentation

## 2020-06-20 DIAGNOSIS — Z79899 Other long term (current) drug therapy: Secondary | ICD-10-CM | POA: Insufficient documentation

## 2020-06-20 DIAGNOSIS — Z87891 Personal history of nicotine dependence: Secondary | ICD-10-CM | POA: Insufficient documentation

## 2020-06-20 DIAGNOSIS — I10 Essential (primary) hypertension: Secondary | ICD-10-CM | POA: Insufficient documentation

## 2020-06-20 DIAGNOSIS — Z7984 Long term (current) use of oral hypoglycemic drugs: Secondary | ICD-10-CM | POA: Insufficient documentation

## 2020-06-20 DIAGNOSIS — Z7982 Long term (current) use of aspirin: Secondary | ICD-10-CM | POA: Insufficient documentation

## 2020-06-20 DIAGNOSIS — E119 Type 2 diabetes mellitus without complications: Secondary | ICD-10-CM | POA: Insufficient documentation

## 2020-06-20 DIAGNOSIS — R0789 Other chest pain: Secondary | ICD-10-CM

## 2020-06-20 HISTORY — DX: Cardiac arrhythmia, unspecified: I49.9

## 2020-06-20 LAB — CBC WITH DIFFERENTIAL/PLATELET
Abs Immature Granulocytes: 0.01 10*3/uL (ref 0.00–0.07)
Basophils Absolute: 0 10*3/uL (ref 0.0–0.1)
Basophils Relative: 0 %
Eosinophils Absolute: 0.1 10*3/uL (ref 0.0–0.5)
Eosinophils Relative: 1 %
HCT: 40.9 % (ref 39.0–52.0)
Hemoglobin: 14.1 g/dL (ref 13.0–17.0)
Immature Granulocytes: 0 %
Lymphocytes Relative: 37 %
Lymphs Abs: 1.7 10*3/uL (ref 0.7–4.0)
MCH: 30.5 pg (ref 26.0–34.0)
MCHC: 34.5 g/dL (ref 30.0–36.0)
MCV: 88.3 fL (ref 80.0–100.0)
Monocytes Absolute: 0.4 10*3/uL (ref 0.1–1.0)
Monocytes Relative: 9 %
Neutro Abs: 2.3 10*3/uL (ref 1.7–7.7)
Neutrophils Relative %: 53 %
Platelets: 178 10*3/uL (ref 150–400)
RBC: 4.63 MIL/uL (ref 4.22–5.81)
RDW: 12.8 % (ref 11.5–15.5)
WBC: 4.5 10*3/uL (ref 4.0–10.5)
nRBC: 0 % (ref 0.0–0.2)

## 2020-06-20 LAB — COMPREHENSIVE METABOLIC PANEL
ALT: 34 U/L (ref 0–44)
AST: 24 U/L (ref 15–41)
Albumin: 4 g/dL (ref 3.5–5.0)
Alkaline Phosphatase: 59 U/L (ref 38–126)
Anion gap: 11 (ref 5–15)
BUN: 11 mg/dL (ref 8–23)
CO2: 26 mmol/L (ref 22–32)
Calcium: 9.2 mg/dL (ref 8.9–10.3)
Chloride: 100 mmol/L (ref 98–111)
Creatinine, Ser: 0.84 mg/dL (ref 0.61–1.24)
GFR, Estimated: >60 mL/min (ref >60)
Glucose, Bld: 222 mg/dL — ABNORMAL HIGH (ref 70–99)
Potassium: 3.7 mmol/L (ref 3.5–5.1)
Sodium: 137 mmol/L (ref 135–145)
Total Bilirubin: 0.6 mg/dL (ref 0.3–1.2)
Total Protein: 7.3 g/dL (ref 6.5–8.1)

## 2020-06-20 LAB — TROPONIN I (HIGH SENSITIVITY)
Troponin I (High Sensitivity): 4 ng/L (ref <18)
Troponin I (High Sensitivity): 4 ng/L (ref <18)

## 2020-06-20 IMAGING — DX DG CHEST 1V PORT
1 series · 1 of 1 positions shown · non-contrast
Comparison: [DATE]

CLINICAL DATA: Chest pain for 3 days

EXAM:
PORTABLE CHEST 1 VIEW

[chest ap]
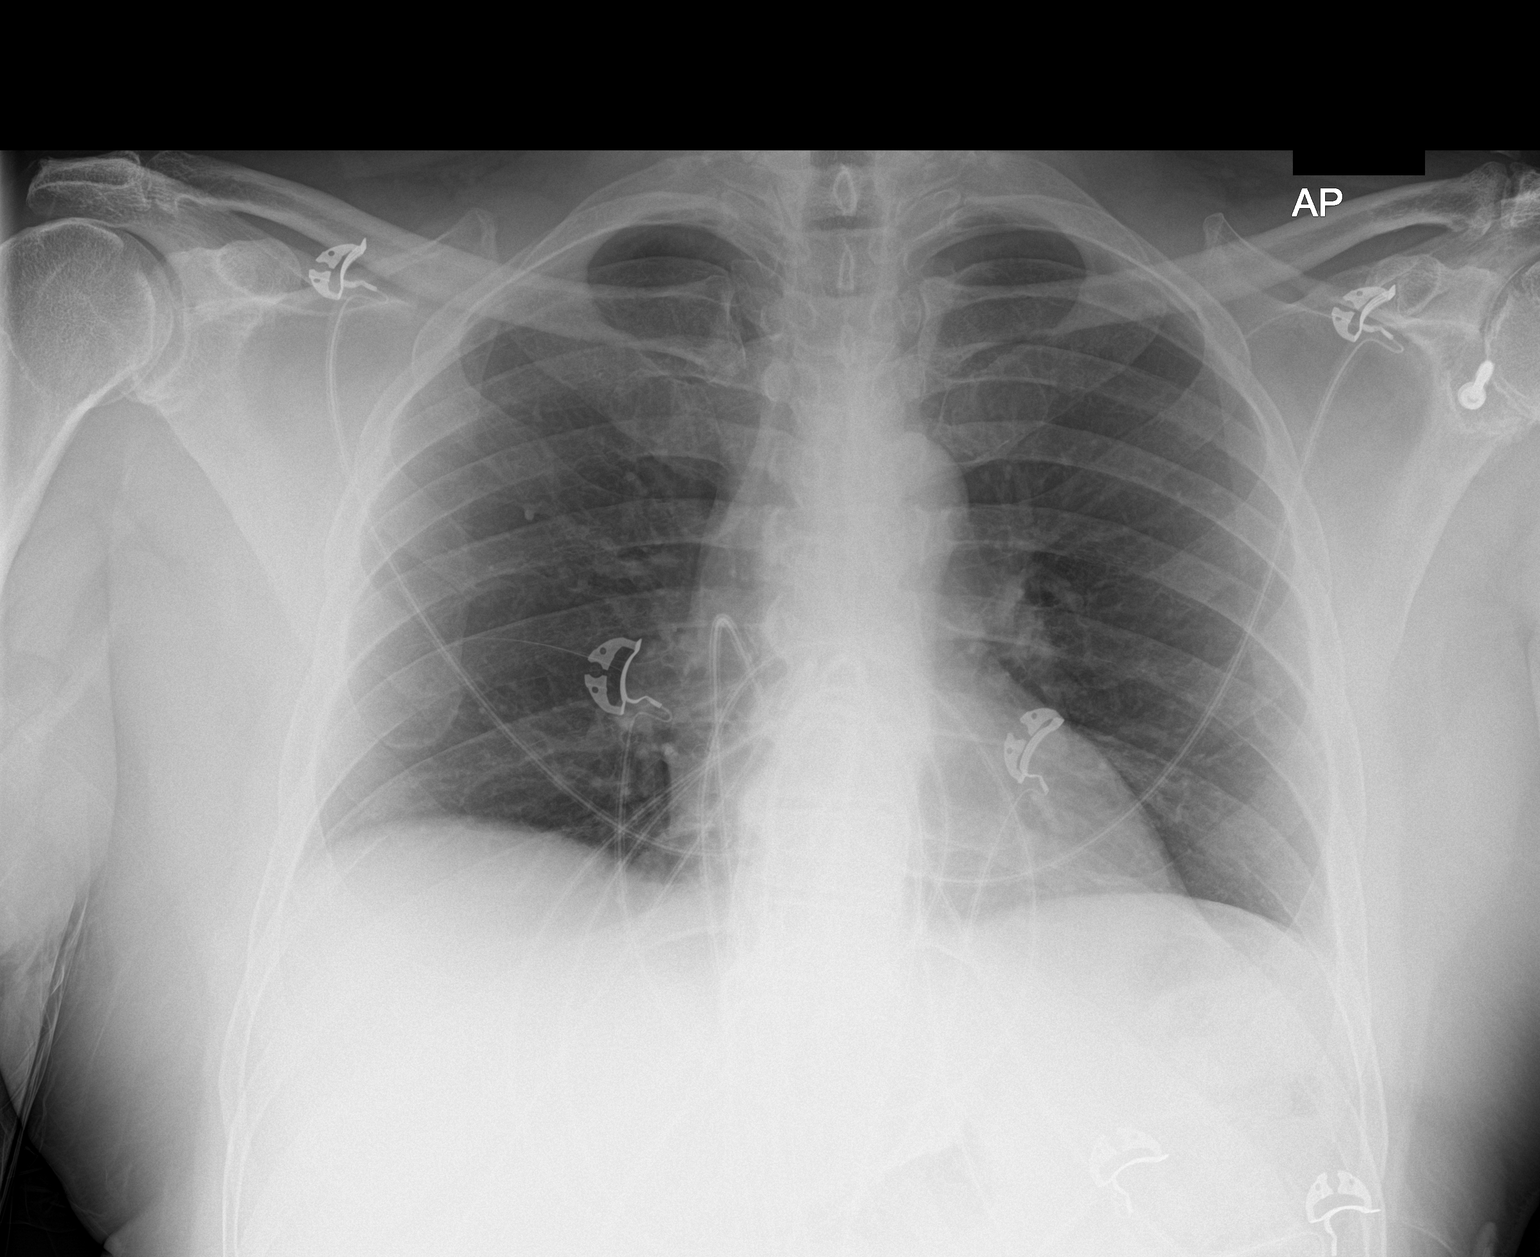

[1 of 1 positions shown; findings below may reference images not displayed]

FINDINGS: The heart size and mediastinal contours are within normal limits.
Both lungs are clear. The visualized skeletal structures are
unremarkable.
IMPRESSION: No active disease.

## 2020-06-20 MED ORDER — KETOROLAC TROMETHAMINE 30 MG/ML IJ SOLN
30.0000 mg | Freq: Once | INTRAMUSCULAR | Status: AC
  Administered 2020-06-20: 30 mg via INTRAVENOUS
  Filled 2020-06-20: qty 1

## 2020-06-20 NOTE — ED Provider Notes (Signed)
[facility] EMERGENCY DEPARTMENT Provider Note   CSN: [id number] Arrival date & time: 06/20/20  1048     History Chief Complaint  Patient presents with  . Chest Pain    Samuel Walters is a 64 y.o. male.  HPI   65 year old male with past medical history of HTN, HLD, DM presents the emergency department with concern for right-sided chest pain.  Patient states the past 3 days he has had this soreness around his right chest.  He states it is worse with certain movements and taking a deep breath.  When he woke up this morning the pain was more uncomfortable so he came in to get checked out.  Denies any previous cardiac history, he is a former smoker.  Denies any shortness of breath, cough, swelling of his lower extremities.  No recent fever or illness.  Past Medical History:  Diagnosis Date  . Diabetes mellitus without complication (HCC)   . Hyperlipidemia   . Hypertension   . Irregular heart beat     There are no problems to display for this patient.   Past Surgical History:  Procedure Laterality Date  . KNEE ARTHROSCOPY WITH ANTERIOR CRUCIATE LIGAMENT (ACL) REPAIR         History reviewed. No pertinent family history.  Social History   Tobacco Use  . Smoking status: Former Smoker    Types: Cigarettes    Quit date: 06/20/1997    Years since quitting: 23.0  . Smokeless tobacco: Never Used  Substance Use Topics  . Alcohol use: No  . Drug use: No    Home Medications Prior to Admission medications   Medication Sig Start Date End Date Taking? Authorizing Provider  aspirin EC 81 MG tablet Take 81 mg by mouth daily.   Yes [provider]  canagliflozin (INVOKANA) 300 MG TABS tablet Take 300 mg by mouth daily before breakfast.   Yes [provider]  cholecalciferol (VITAMIN D3) 25 MCG (1000 UNIT) tablet Take 1,000 Units by mouth daily.   Yes [provider]  insulin glargine (LANTUS) 100 UNIT/ML injection Inject into the skin at  bedtime.   Yes [provider]  losartan-hydrochlorothiazide (HYZAAR) 100-12.5 MG tablet Take 1 tablet by mouth daily.   Yes [provider]  lovastatin (MEVACOR) 20 MG tablet Take 20 mg by mouth at bedtime.   Yes [provider]  metFORMIN (GLUCOPHAGE-XR) 500 MG 24 hr tablet Take 500 mg by mouth daily with breakfast.   Yes [provider]  Multiple Vitamin (ONE DAILY) tablet Take 1 tablet by mouth daily.   Yes [provider]  meloxicam (MOBIC) 15 MG tablet Take 15 mg by mouth daily.    [provider]    Allergies    Codeine  Review of Systems   Review of Systems  Constitutional: Negative for chills and fever.  HENT: Negative for congestion.   Eyes: Negative for visual disturbance.  Respiratory: Negative for chest tightness and shortness of breath.   Cardiovascular: Positive for chest pain. Negative for palpitations and leg swelling.  Gastrointestinal: Negative for abdominal pain, diarrhea and vomiting.  Genitourinary: Negative for dysuria.  Skin: Negative for rash.  Neurological: Negative for headaches.    Physical Exam Updated Vital Signs BP (!) 143/78   Pulse 72   Temp 98 F (36.7 C) (Oral)   Resp 14   Ht 5\' 11"  (1.803 m)   Wt 88.9 kg   SpO2 98%   BMI 27.34 kg/m  Physical Exam Vitals and nursing note reviewed.  Constitutional:      Appearance: Normal appearance.  HENT:     Head: Normocephalic.     Mouth/Throat:     Mouth: Mucous membranes are moist.  Cardiovascular:     Rate and Rhythm: Normal rate.  Pulmonary:     Effort: Pulmonary effort is normal. No respiratory distress.     Breath sounds: No decreased breath sounds.  Chest:     Chest wall: No crepitus.     Comments: Slight soreness to palpation of the right side of the chest below the pectoralis muscle Abdominal:     Palpations: Abdomen is soft.     Tenderness: There is no abdominal tenderness.  Musculoskeletal:     Right lower leg: No edema.      Left lower leg: No edema.  Skin:    General: Skin is warm.  Neurological:     Mental Status: He is alert and oriented to person, place, and time. Mental status is at baseline.  Psychiatric:        Mood and Affect: Mood normal.     ED Results / Procedures / Treatments   Labs (all labs ordered are listed, but only abnormal results are displayed) Labs Reviewed  COMPREHENSIVE METABOLIC PANEL - Abnormal; Notable for the following components:      Result Value   Glucose, Bld 222 (*)    All other components within normal limits  CBC WITH DIFFERENTIAL/PLATELET  TROPONIN I (HIGH SENSITIVITY)  TROPONIN I (HIGH SENSITIVITY)    EKG EKG Interpretation  Date/Time:  Saturday June 20 2020 10:57:18 EDT Ventricular Rate:  83 PR Interval:  156 QRS Duration: 105 QT Interval:  375 QTC Calculation: 441 R Axis:   -6 Text Interpretation: Sinus rhythm NSR, no ischemic changes Confirmed by Coralee Pesa [phone]) on 06/20/2020 1:12:54 PM   Radiology DG Chest Port 1 View  Result Date: 06/20/2020 CLINICAL DATA:  Chest pain for 3 days EXAM: PORTABLE CHEST 1 VIEW COMPARISON:  10/17/2011 FINDINGS: The heart size and mediastinal contours are within normal limits. Both lungs are clear. The visualized skeletal structures are unremarkable. IMPRESSION: No active disease. Electronically Signed   By: Acquanetta Belling M.D.   On: 06/20/2020 12:14    Procedures Procedures   Medications Ordered in ED Medications  ketorolac (TORADOL) 30 MG/ML injection 30 mg (30 mg Intravenous Given 06/20/20 1127)    ED Course  I have reviewed the triage vital signs and the nursing notes.  Pertinent labs & imaging results that were available during my care of the patient were reviewed by me and considered in my medical decision making (see chart for details).    MDM Rules/Calculators/A&P                          65 year old male presents the emergency department for right-sided chest pain that is reproducible with certain  movements and deep breaths.  It is also slightly reproducible with palpation.  EKG shows sinus rhythm with a rate of 83, no acute ischemic changes.  Chest x-ray is unremarkable, troponins are negative with no delta.  After Toradol the pain has gone away.  This appears to have a very musculoskeletal aspect to it.  He is a low heart score.  I find this atypical for ACS.  No tachycardia, shortness of breath or hypoxia, low suspicion for PE.  No signs or symptoms concerning for dissection.  Discussed with the patient to  follow-up as an outpatient for further cardiology evaluation.  He understands.  Patient will be discharged and treated as an outpatient.  Discharge plan and strict return to ED precautions discussed, patient verbalizes understanding and agreement.  Final Clinical Impression(s) / ED Diagnoses Final diagnoses:  None    Rx / DC Orders ED Discharge Orders    None       Rozelle Logan, DO 06/20/20 1457

## 2020-06-20 NOTE — Discharge Instructions (Addendum)
You have been seen and discharged from the emergency department.  Take Tylenol ibuprofen as needed for chest discomfort.  Your work-up today was normal but you still need to follow-up as an outpatient to have further cardiac testing.  Follow-up with your primary provider for further cardiac testing, reevaluation and further care. Take home medications as prescribed. If you have any worsening symptoms or further concerns for health please return to an emergency department for further evaluation.

## 2020-06-20 NOTE — ED Triage Notes (Signed)
Pt c/o right sided chest pressure x 3days. States has been getting worse. Worse with movement and breathing.
